# Patient Record
Sex: Female | Born: 1970 | Race: Black or African American | Hispanic: No | Smoking: Never smoker
Health system: Southern US, Community
[De-identification: ages and names within clinical notes are randomized; demographics above are authoritative.]

## PROBLEM LIST (undated history)

## (undated) DIAGNOSIS — I10 Essential (primary) hypertension: Secondary | ICD-10-CM

## (undated) DIAGNOSIS — M329 Systemic lupus erythematosus, unspecified: Secondary | ICD-10-CM

## (undated) DIAGNOSIS — IMO0002 Reserved for concepts with insufficient information to code with codable children: Secondary | ICD-10-CM

## (undated) HISTORY — PX: BUNIONETTE EXCISION: SUR500

## (undated) HISTORY — DX: Systemic lupus erythematosus, unspecified: M32.9

## (undated) HISTORY — DX: Essential (primary) hypertension: I10

## (undated) HISTORY — DX: Reserved for concepts with insufficient information to code with codable children: IMO0002

---

## 1998-03-20 ENCOUNTER — Emergency Department (HOSPITAL_COMMUNITY): Admission: EM | Admit: 1998-03-20 | Discharge: 1998-03-20 | Payer: Self-pay

## 1998-05-13 ENCOUNTER — Emergency Department (HOSPITAL_COMMUNITY): Admission: EM | Admit: 1998-05-13 | Discharge: 1998-05-13 | Payer: Self-pay | Admitting: Emergency Medicine

## 1998-08-17 ENCOUNTER — Other Ambulatory Visit: Admission: RE | Admit: 1998-08-17 | Discharge: 1998-08-17 | Payer: Self-pay | Admitting: Obstetrics and Gynecology

## 1999-08-08 ENCOUNTER — Other Ambulatory Visit: Admission: RE | Admit: 1999-08-08 | Discharge: 1999-08-08 | Payer: Self-pay | Admitting: Obstetrics and Gynecology

## 2000-05-08 ENCOUNTER — Encounter: Payer: Self-pay | Admitting: Endocrinology

## 2000-05-08 ENCOUNTER — Ambulatory Visit (HOSPITAL_COMMUNITY): Admission: RE | Admit: 2000-05-08 | Discharge: 2000-05-08 | Payer: Self-pay | Admitting: Endocrinology

## 2000-09-15 ENCOUNTER — Other Ambulatory Visit: Admission: RE | Admit: 2000-09-15 | Discharge: 2000-09-15 | Payer: Self-pay | Admitting: Obstetrics and Gynecology

## 2006-04-08 ENCOUNTER — Encounter: Admission: RE | Admit: 2006-04-08 | Discharge: 2006-04-08 | Payer: Self-pay | Admitting: Family Medicine

## 2008-03-24 IMAGING — CR DG CHEST 2V
2 series · 2 of 2 positions shown · non-contrast
Comparison: none

CLINICAL DATA: Chest pain

Chest 2 view:
No previous for comparison. The heart size and mediastinal contours are within
normal limits.  Both lungs are clear.  The visualized skeletal structures are
unremarkable.

[view not recorded (1 of 2)]
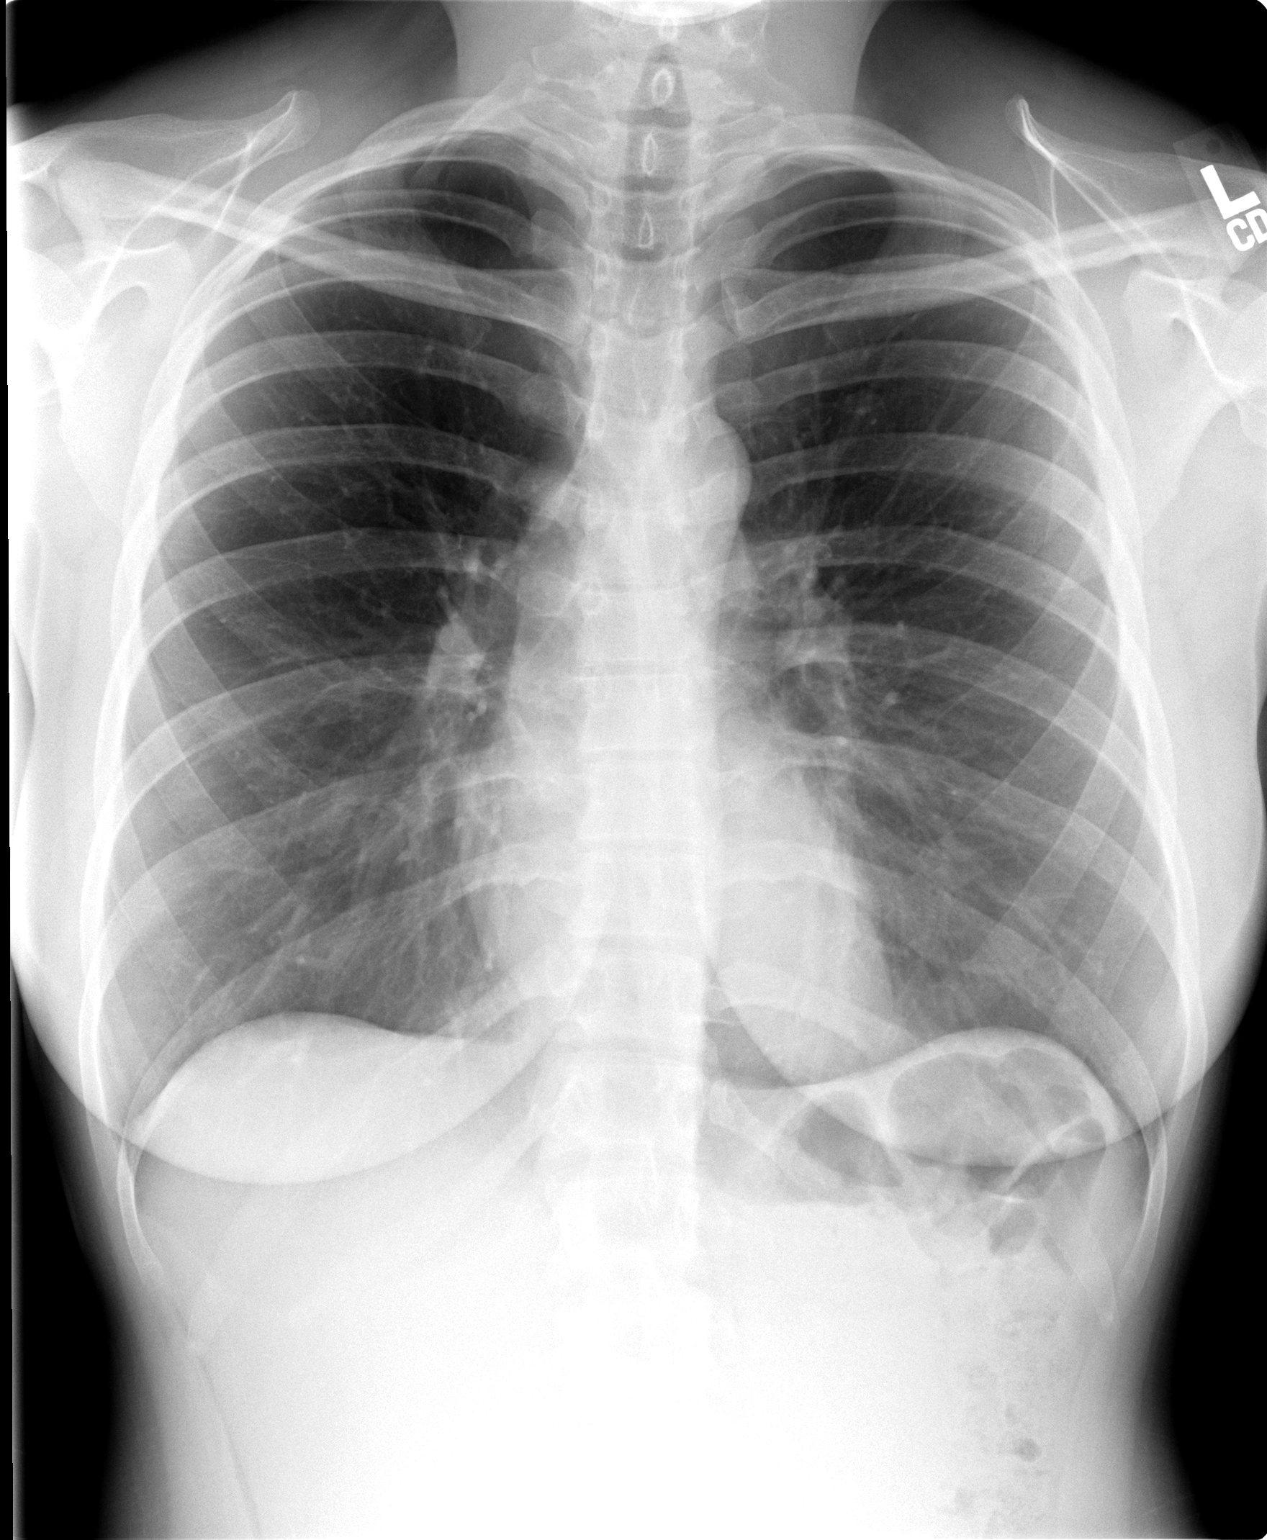

[view not recorded (2 of 2)]
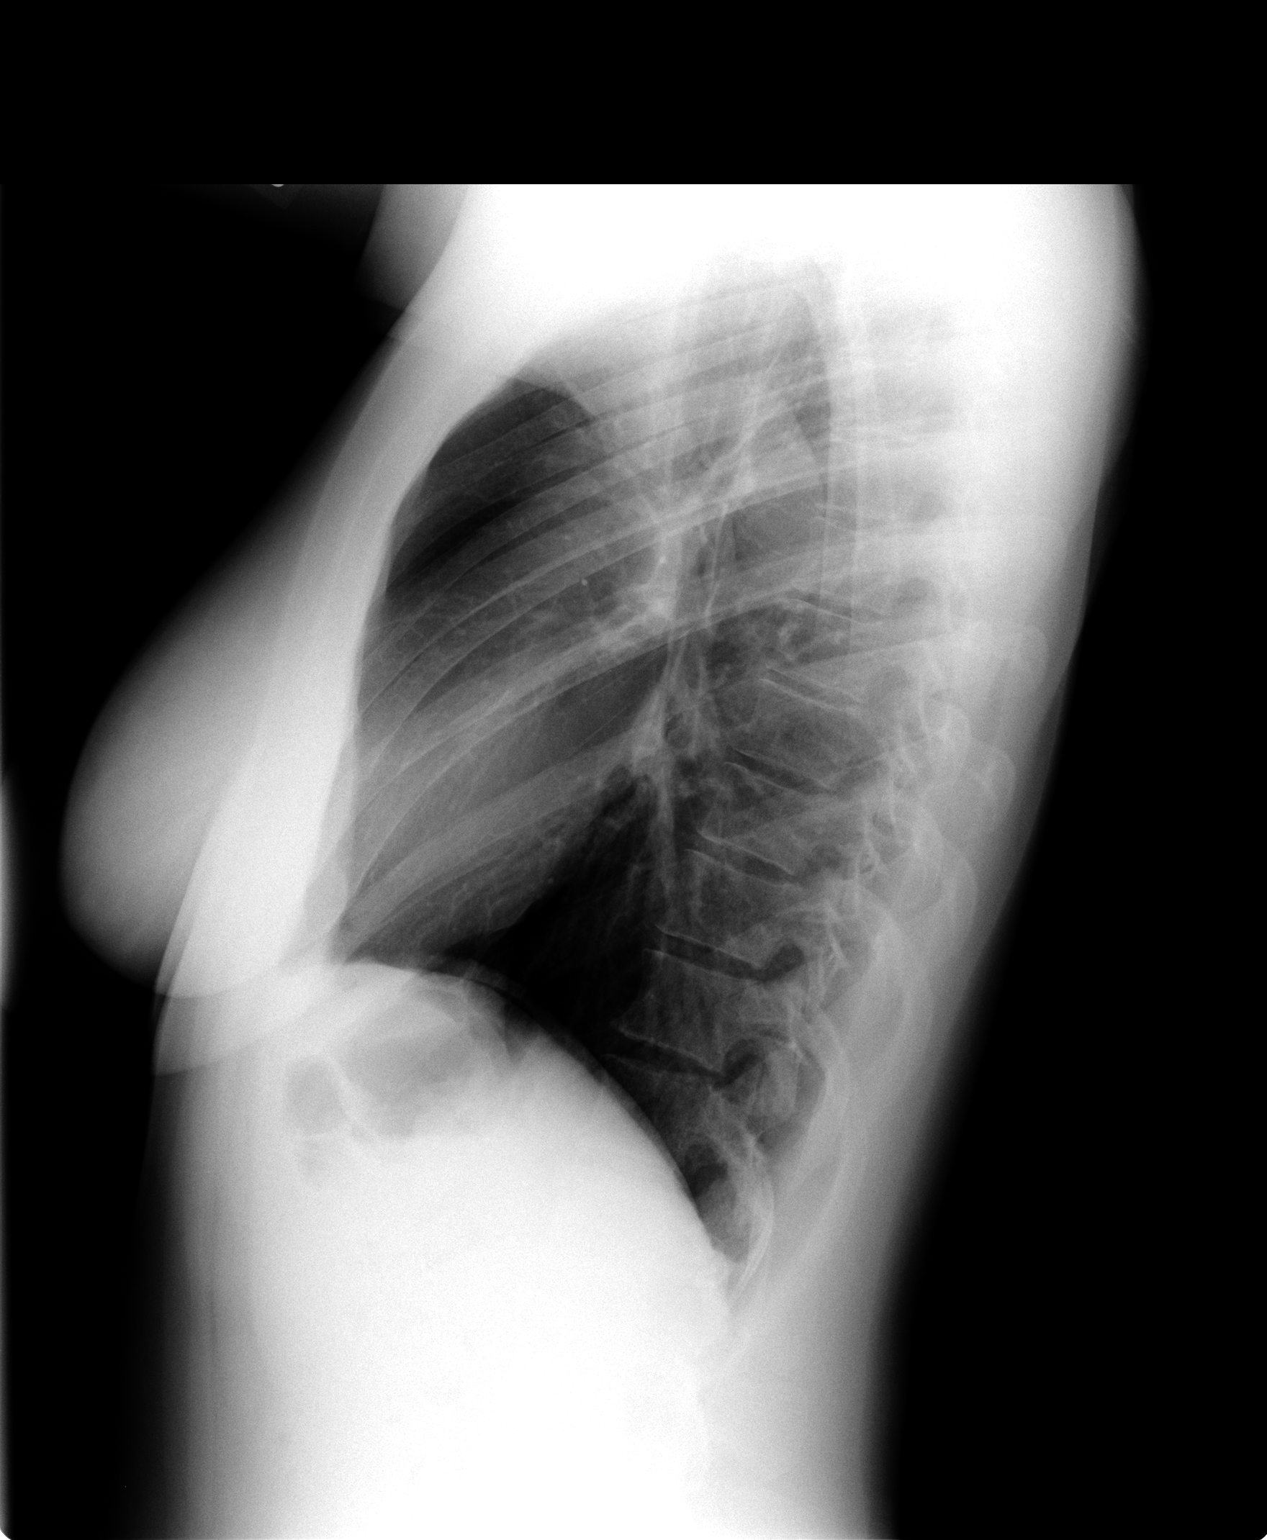

[2 of 2 positions shown; findings below may reference images not displayed]

IMPRESSION: 1. No active cardiopulmonary disease.

## 2009-04-02 ENCOUNTER — Inpatient Hospital Stay (HOSPITAL_COMMUNITY): Admission: AD | Admit: 2009-04-02 | Discharge: 2009-04-05 | Payer: Self-pay | Admitting: Obstetrics and Gynecology

## 2009-04-03 ENCOUNTER — Encounter (INDEPENDENT_AMBULATORY_CARE_PROVIDER_SITE_OTHER): Payer: Self-pay | Admitting: Obstetrics and Gynecology

## 2010-09-26 LAB — COMPREHENSIVE METABOLIC PANEL
AST: 36 U/L (ref 0–37)
CO2: 28 mEq/L (ref 19–32)
Calcium: 9.3 mg/dL (ref 8.4–10.5)
Creatinine, Ser: 0.85 mg/dL (ref 0.4–1.2)
GFR calc Af Amer: >60 mL/min (ref >60)
GFR calc non Af Amer: >60 mL/min (ref >60)
Total Protein: 5.4 g/dL — ABNORMAL LOW (ref 6.0–8.3)

## 2010-09-26 LAB — RPR: RPR Ser Ql: NONREACTIVE

## 2010-09-26 LAB — CBC
HCT: 39.2 % (ref 36.0–46.0)
Hemoglobin: 13.5 g/dL (ref 12.0–15.0)
MCHC: 34.5 g/dL (ref 30.0–36.0)
MCHC: 34.5 g/dL (ref 30.0–36.0)
MCV: 97.3 fL (ref 78.0–100.0)
MCV: 98.9 fL (ref 78.0–100.0)
Platelets: 169 10*3/uL (ref 150–400)
Platelets: 193 10*3/uL (ref 150–400)
RBC: 3.69 MIL/uL — ABNORMAL LOW (ref 3.87–5.11)
RBC: 4.02 MIL/uL (ref 3.87–5.11)
RDW: 12.8 % (ref 11.5–15.5)
RDW: 12.9 % (ref 11.5–15.5)
WBC: 12 10*3/uL — ABNORMAL HIGH (ref 4.0–10.5)

## 2010-09-26 LAB — RH IMMUNE GLOB WKUP(>/=20WKS)(NOT WOMEN'S HOSP)

## 2010-09-26 LAB — ANTI-DNA ANTIBODY, DOUBLE-STRANDED: ds DNA Ab: <1 IU/mL (ref <5)

## 2012-09-20 ENCOUNTER — Encounter: Payer: Self-pay | Admitting: Vascular Surgery

## 2012-09-20 ENCOUNTER — Other Ambulatory Visit: Payer: Self-pay | Admitting: *Deleted

## 2012-09-20 DIAGNOSIS — L819 Disorder of pigmentation, unspecified: Secondary | ICD-10-CM

## 2012-09-20 DIAGNOSIS — M329 Systemic lupus erythematosus, unspecified: Secondary | ICD-10-CM

## 2012-09-21 ENCOUNTER — Encounter (INDEPENDENT_AMBULATORY_CARE_PROVIDER_SITE_OTHER): Payer: BC Managed Care – PPO | Admitting: *Deleted

## 2012-09-21 ENCOUNTER — Ambulatory Visit (INDEPENDENT_AMBULATORY_CARE_PROVIDER_SITE_OTHER): Payer: BC Managed Care – PPO | Admitting: Vascular Surgery

## 2012-09-21 ENCOUNTER — Encounter: Payer: Self-pay | Admitting: Vascular Surgery

## 2012-09-21 VITALS — BP 159/111 | HR 89 | Resp 18 | Ht 64.0 in | Wt 172.0 lb

## 2012-09-21 DIAGNOSIS — L97509 Non-pressure chronic ulcer of other part of unspecified foot with unspecified severity: Secondary | ICD-10-CM

## 2012-09-21 DIAGNOSIS — M329 Systemic lupus erythematosus, unspecified: Secondary | ICD-10-CM

## 2012-09-21 DIAGNOSIS — M79609 Pain in unspecified limb: Secondary | ICD-10-CM

## 2012-09-21 DIAGNOSIS — L819 Disorder of pigmentation, unspecified: Secondary | ICD-10-CM

## 2012-09-21 NOTE — Progress Notes (Signed)
Vascular and Vein Specialist of [facility]   Patient name: Lori Lin MRN: [id number] DOB: 02-24-1971 Sex: female   Referred by: Parke Simmers  Reason for referral:  Chief Complaint  Patient presents with  . New Evaluation    Cyanosis of B/L feet  right worse    HISTORY OF PRESENT ILLNESS: Patient is a very pleasant 42 year old female who is diagnosed with lupus around age 51. 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 He typically does not have any pain associated with this but does have the classic color changes particularly with cold exposure. Over the past several months he has had a more cyanosis particular he in her right second and fifth toe. She does have pain associated with this now. She does have some very superficial callus and blistering on the tip of her right second toe. He does not have any pain in her hands. Has been treated with nifedipine in the past. Fortunately she has never smoked cigarettes. She did have bilateral bunion surgery in the past with no difficulty healing  Past Medical History  Diagnosis Date  . Lupus   . Hypertension     Past Surgical History  Procedure Laterality Date  . Bunionette excision Bilateral     History   Social History  . Marital Status: Divorced    Spouse Name: N/A    Number of Children: N/A  . Years of Education: N/A   Occupational History  . Not on file.   Social History Main Topics  . Smoking status: Never Smoker   . Smokeless tobacco: Never Used  . Alcohol Use: Yes  . Drug Use: No  . Sexually Active: Not on file   Other Topics Concern  . Not on file   Social History Narrative  . No narrative on file    Family History  Problem Relation Age of Onset  . Hypertension Mother   . Hypertension Father     Allergies as of 09/21/2012  . (No Known Allergies)    Current Outpatient Prescriptions on File Prior to Visit  Medication Sig Dispense Refill  . amLODipine (NORVASC) 5 MG tablet Take 5 mg by  mouth daily.      . hydrALAZINE (APRESOLINE) 25 MG tablet Take 25 mg by mouth 3 (three) times daily.      . hydroxychloroquine (PLAQUENIL) 200 MG tablet Take 200 mg by mouth 2 (two) times daily.       No current facility-administered medications on file prior to visit.     REVIEW OF SYSTEMS:  Positives indicated with an "X"  CARDIOVASCULAR:  [ ]  chest pain   [ ]  chest pressure   [ ]  palpitations   [ ]  orthopnea   [ ]  dyspnea on exertion   [ ]  claudication   [ ]  rest pain   [ ]  DVT   [ ]  phlebitis PULMONARY:   [ ]  productive cough   [ ]  asthma   [ ]  wheezing NEUROLOGIC:   [ ]  weakness  [ ]  paresthesias  [ ]  aphasia  [ ]  amaurosis  [ ]  dizziness HEMATOLOGIC:   [ ]  bleeding problems   [ ]  clotting disorders MUSCULOSKELETAL:  [ ]  joint pain   [ ]  joint swelling GASTROINTESTINAL: [ ]   blood in stool  [ ]   hematemesis GENITOURINARY:  [ ]   dysuria  [ ]   hematuria PSYCHIATRIC:  [ ]  history of major depression INTEGUMENTARY:  [ ]  rashes  [ ]  ulcers  CONSTITUTIONAL:  [ ]  fever   [ ]  chills  PHYSICAL EXAMINATION:  General: The patient is a well-nourished female, in no acute distress. Vital signs are BP 159/111  Pulse 89  Resp 18  Ht 5\' 4"  (1.626 m)  Wt 172 lb (78.019 kg)  BMI 29.51 kg/m2 Pulmonary: There is a good air exchange bilaterally without wheezing or rales. Abdomen: Soft and non-tender with normal pitch bowel sounds. Musculoskeletal: There are no major deformities.  Neurologic: No focal weakness or paresthesias are detected, Skin: Cyanosis alternating with rubor in her hands and to a lesser degree in her feet during the examination. She does have a rather fixed cyanosis in her right second and fifth toes. Psychiatric: The patient has normal affect. Cardiovascular: There is a regular rate and rhythm without significant murmur appreciated. Pulse status: 2+ radial 2+ femoral 2+ popliteal and 2+ dorsalis pedis pulse pulses bilaterally  [facility] Vascular Lab Studies:  Ordered and  Independently Reviewed normal ankle arm index bilaterally with biphasic posterior tibial and dorsalis pedis waveforms bilaterally she does have dampened the great toe waveform on the right and absent waveforms on the left great toe.  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 I explained there is no vas or surgical treatment options for this. She will continue to followup with Dr. Parke Simmers and her rheumatologist for maximal medical treatment of her lupus. I suspect that she will go on to healing of the lesions on her right toes. I explained that is outside chance this may progress to tissue loss and may even require toe amputation. I spent this is unlikely and that she should take some appeared or time but will eventually resolved. She'll notify should she develop any progressive tissue loss which time we will follow this to determine if amputation is required. Otherwise she will followup on an as-needed basis    EARLY, TODD V[facility] and Ve[facility] Specialists of [place] Office: [phone]

## 2012-09-22 ENCOUNTER — Encounter: Payer: Self-pay | Admitting: Family Medicine

## 2013-04-15 ENCOUNTER — Other Ambulatory Visit: Payer: Self-pay

## 2013-04-15 DIAGNOSIS — Z1231 Encounter for screening mammogram for malignant neoplasm of breast: Secondary | ICD-10-CM

## 2013-05-06 ENCOUNTER — Ambulatory Visit
Admission: RE | Admit: 2013-05-06 | Discharge: 2013-05-06 | Disposition: A | Discharge status: Home / Self Care | Payer: Self-pay | Source: Ambulatory Visit

## 2013-05-06 DIAGNOSIS — Z1231 Encounter for screening mammogram for malignant neoplasm of breast: Secondary | ICD-10-CM

## 2013-05-10 ENCOUNTER — Other Ambulatory Visit: Payer: Self-pay | Admitting: Obstetrics and Gynecology

## 2013-05-10 DIAGNOSIS — R928 Other abnormal and inconclusive findings on diagnostic imaging of breast: Secondary | ICD-10-CM

## 2013-06-03 ENCOUNTER — Ambulatory Visit
Admission: RE | Admit: 2013-06-03 | Discharge: 2013-06-03 | Disposition: A | Discharge status: Home / Self Care | Payer: Self-pay | Source: Ambulatory Visit | Attending: Obstetrics and Gynecology | Admitting: Obstetrics and Gynecology

## 2013-06-03 DIAGNOSIS — R928 Other abnormal and inconclusive findings on diagnostic imaging of breast: Secondary | ICD-10-CM

## 2013-11-24 ENCOUNTER — Other Ambulatory Visit: Payer: Self-pay | Admitting: Obstetrics and Gynecology

## 2013-11-24 DIAGNOSIS — N6489 Other specified disorders of breast: Secondary | ICD-10-CM

## 2013-11-24 DIAGNOSIS — R922 Inconclusive mammogram: Secondary | ICD-10-CM

## 2013-12-09 ENCOUNTER — Ambulatory Visit
Admission: RE | Admit: 2013-12-09 | Discharge: 2013-12-09 | Disposition: A | Discharge status: Home / Self Care | Payer: BC Managed Care – PPO | Source: Ambulatory Visit | Attending: Obstetrics and Gynecology | Admitting: Obstetrics and Gynecology

## 2013-12-09 DIAGNOSIS — R922 Inconclusive mammogram: Secondary | ICD-10-CM

## 2013-12-09 DIAGNOSIS — N6489 Other specified disorders of breast: Secondary | ICD-10-CM

## 2014-07-17 ENCOUNTER — Other Ambulatory Visit: Payer: Self-pay | Admitting: Obstetrics and Gynecology

## 2014-07-17 DIAGNOSIS — N6041 Mammary duct ectasia of right breast: Secondary | ICD-10-CM

## 2014-07-17 DIAGNOSIS — N63 Unspecified lump in unspecified breast: Secondary | ICD-10-CM

## 2014-07-24 ENCOUNTER — Ambulatory Visit
Admission: RE | Admit: 2014-07-24 | Discharge: 2014-07-24 | Disposition: A | Discharge status: Home / Self Care | Payer: BLUE CROSS/BLUE SHIELD | Source: Ambulatory Visit | Attending: Obstetrics and Gynecology | Admitting: Obstetrics and Gynecology

## 2014-07-24 DIAGNOSIS — N6041 Mammary duct ectasia of right breast: Secondary | ICD-10-CM

## 2016-03-31 ENCOUNTER — Other Ambulatory Visit: Payer: Self-pay | Admitting: Obstetrics and Gynecology

## 2016-03-31 DIAGNOSIS — Z1231 Encounter for screening mammogram for malignant neoplasm of breast: Secondary | ICD-10-CM

## 2016-04-11 ENCOUNTER — Ambulatory Visit
Admission: RE | Admit: 2016-04-11 | Discharge: 2016-04-11 | Disposition: A | Discharge status: Home / Self Care | Payer: BLUE CROSS/BLUE SHIELD | Source: Ambulatory Visit | Attending: Obstetrics and Gynecology | Admitting: Obstetrics and Gynecology

## 2016-04-11 ENCOUNTER — Encounter: Payer: Self-pay | Admitting: Radiology

## 2016-04-11 DIAGNOSIS — Z1231 Encounter for screening mammogram for malignant neoplasm of breast: Secondary | ICD-10-CM

## 2018-03-09 DIAGNOSIS — Z79899 Other long term (current) drug therapy: Secondary | ICD-10-CM | POA: Diagnosis not present

## 2018-03-09 DIAGNOSIS — N390 Urinary tract infection, site not specified: Secondary | ICD-10-CM | POA: Diagnosis not present

## 2018-03-09 DIAGNOSIS — M5136 Other intervertebral disc degeneration, lumbar region: Secondary | ICD-10-CM | POA: Diagnosis not present

## 2018-03-09 DIAGNOSIS — M329 Systemic lupus erythematosus, unspecified: Secondary | ICD-10-CM | POA: Diagnosis not present

## 2018-03-09 DIAGNOSIS — M549 Dorsalgia, unspecified: Secondary | ICD-10-CM | POA: Diagnosis not present

## 2018-03-09 DIAGNOSIS — I7300 Raynaud's syndrome without gangrene: Secondary | ICD-10-CM | POA: Diagnosis not present

## 2018-04-13 DIAGNOSIS — M329 Systemic lupus erythematosus, unspecified: Secondary | ICD-10-CM | POA: Diagnosis not present

## 2018-04-22 DIAGNOSIS — K7689 Other specified diseases of liver: Secondary | ICD-10-CM | POA: Diagnosis not present

## 2018-04-22 DIAGNOSIS — R945 Abnormal results of liver function studies: Secondary | ICD-10-CM | POA: Diagnosis not present

## 2018-04-22 DIAGNOSIS — M329 Systemic lupus erythematosus, unspecified: Secondary | ICD-10-CM | POA: Diagnosis not present

## 2018-09-02 DIAGNOSIS — L729 Follicular cyst of the skin and subcutaneous tissue, unspecified: Secondary | ICD-10-CM | POA: Diagnosis not present

## 2018-09-02 DIAGNOSIS — L089 Local infection of the skin and subcutaneous tissue, unspecified: Secondary | ICD-10-CM | POA: Diagnosis not present

## 2019-01-20 DIAGNOSIS — M329 Systemic lupus erythematosus, unspecified: Secondary | ICD-10-CM | POA: Diagnosis not present

## 2019-01-20 DIAGNOSIS — M549 Dorsalgia, unspecified: Secondary | ICD-10-CM | POA: Diagnosis not present

## 2019-01-20 DIAGNOSIS — I7300 Raynaud's syndrome without gangrene: Secondary | ICD-10-CM | POA: Diagnosis not present

## 2019-01-20 DIAGNOSIS — Z79899 Other long term (current) drug therapy: Secondary | ICD-10-CM | POA: Diagnosis not present

## 2019-02-10 DIAGNOSIS — Z1151 Encounter for screening for human papillomavirus (HPV): Secondary | ICD-10-CM | POA: Diagnosis not present

## 2019-02-10 DIAGNOSIS — N938 Other specified abnormal uterine and vaginal bleeding: Secondary | ICD-10-CM | POA: Diagnosis not present

## 2019-02-10 DIAGNOSIS — Z6827 Body mass index (BMI) 27.0-27.9, adult: Secondary | ICD-10-CM | POA: Diagnosis not present

## 2019-02-10 DIAGNOSIS — E039 Hypothyroidism, unspecified: Secondary | ICD-10-CM | POA: Diagnosis not present

## 2019-02-10 DIAGNOSIS — Z1231 Encounter for screening mammogram for malignant neoplasm of breast: Secondary | ICD-10-CM | POA: Diagnosis not present

## 2019-02-10 DIAGNOSIS — Z01419 Encounter for gynecological examination (general) (routine) without abnormal findings: Secondary | ICD-10-CM | POA: Diagnosis not present

## 2019-08-22 DIAGNOSIS — I7300 Raynaud's syndrome without gangrene: Secondary | ICD-10-CM | POA: Diagnosis not present

## 2019-08-22 DIAGNOSIS — M329 Systemic lupus erythematosus, unspecified: Secondary | ICD-10-CM | POA: Diagnosis not present

## 2019-08-22 DIAGNOSIS — M549 Dorsalgia, unspecified: Secondary | ICD-10-CM | POA: Diagnosis not present

## 2019-08-22 DIAGNOSIS — Z79899 Other long term (current) drug therapy: Secondary | ICD-10-CM | POA: Diagnosis not present

## 2019-09-23 DIAGNOSIS — H35411 Lattice degeneration of retina, right eye: Secondary | ICD-10-CM | POA: Diagnosis not present

## 2019-09-23 DIAGNOSIS — H25013 Cortical age-related cataract, bilateral: Secondary | ICD-10-CM | POA: Diagnosis not present

## 2019-09-23 DIAGNOSIS — Z79899 Other long term (current) drug therapy: Secondary | ICD-10-CM | POA: Diagnosis not present

## 2019-09-23 DIAGNOSIS — H04123 Dry eye syndrome of bilateral lacrimal glands: Secondary | ICD-10-CM | POA: Diagnosis not present

## 2020-04-21 ENCOUNTER — Ambulatory Visit: Payer: BLUE CROSS/BLUE SHIELD | Attending: Internal Medicine

## 2020-04-21 DIAGNOSIS — Z23 Encounter for immunization: Secondary | ICD-10-CM

## 2020-04-21 NOTE — Progress Notes (Signed)
   Covid-19 Vaccination Clinic  Name:  Sylvia Helms    MRN: [id number] DOB: 1971/01/02  04/21/2020  Ms. Brauner was observed post Covid-19 immunization for 15 minutes without incident. She was provided with Vaccine Information Sheet and instruction to access the V-Safe system.   Ms. Brennan was instructed to call [phone] with any severe reactions post vaccine: Marland Kitchen Difficulty breathing  . Swelling of face and throat  . A fast heartbeat  . A bad rash all over body  . Dizziness and weakness

## 2020-11-05 ENCOUNTER — Other Ambulatory Visit: Payer: Self-pay

## 2020-11-05 ENCOUNTER — Ambulatory Visit: Payer: BC Managed Care – PPO | Admitting: Cardiology

## 2020-11-05 ENCOUNTER — Encounter: Payer: Self-pay | Admitting: Cardiology

## 2020-11-05 VITALS — BP 185/116 | HR 109 | Temp 97.4°F | Resp 100 | Ht 64.0 in | Wt 170.2 lb

## 2020-11-05 DIAGNOSIS — M329 Systemic lupus erythematosus, unspecified: Secondary | ICD-10-CM

## 2020-11-05 DIAGNOSIS — I10 Essential (primary) hypertension: Secondary | ICD-10-CM

## 2020-11-05 MED ORDER — METOPROLOL SUCCINATE ER 50 MG PO TB24: 50.0000 mg | ORAL_TABLET | Freq: Every day | ORAL | 2 refills | Status: DC

## 2020-11-05 MED ORDER — AMLODIPINE BESYLATE 10 MG PO TABS: 10.0000 mg | ORAL_TABLET | Freq: Every evening | ORAL | 2 refills | Status: DC

## 2020-11-05 MED ORDER — VALSARTAN-HYDROCHLOROTHIAZIDE 80-12.5 MG PO TABS: 1.0000 | ORAL_TABLET | ORAL | 2 refills | Status: DC

## 2020-11-05 NOTE — Progress Notes (Signed)
Primary Physician/Referring:  Ellender Hose, NP  Patient ID: Lori Lin, female    DOB: [birth year], 50 y.o.   MRN: [id number]  Chief Complaint  Patient presents with  . Hypertension  . Pre-op Exam  . New Patient (Initial Visit)    Referred by Dr. Eulah Pont   HPI:    Lori Lin  is a 50 y.o. African-American female with systemic lupus erythematosus, presently on hydrochloric when, symptoms have been well managed with regard to arthritis.  She has had an accidental fall and needs left ankle surgery, preoperative evaluation reviewed markedly elevated blood pressure.  Stat consultation was obtained by Dr. Margarita Rana for cardiac restratification.  Patient denies any symptoms of dyspnea, PND or orthopnea, headache, visual disturbances.  States that she has never been told to have hypertension in the past until her ankle injury she has noticed elevated blood pressure.  She was started on amlodipine 5 mg by her PCP and metoprolol succinate 25 mg daily by Dr. Margarita Rana 2 days ago.  Past Medical History:  Diagnosis Date  . Hypertension   . Lupus [facility])    Past Surgical History:  Procedure Laterality Date  . BUNIONETTE EXCISION Bilateral    Family History  Problem Relation Age of Onset  . Hypertension Mother   . Seizures Mother   . Hypertension Father   . Heart attack Father     Social History   Tobacco Use  . Smoking status: Never Smoker  . Smokeless tobacco: Never Used  . Tobacco comment: 1 cigarette in her life  Substance Use Topics  . Alcohol use: Yes    Alcohol/week: 21.0 standard drinks    Types: 21 Glasses of wine per week   Marital Status: Divorced  ROS  Review of Systems  Cardiovascular: Negative for chest pain, dyspnea on exertion and leg swelling.  Musculoskeletal: Positive for joint pain and joint swelling.  Gastrointestinal: Negative for melena.   Objective  Blood pressure (!) 185/116, pulse (!) 109, temperature (!) 97.4 F (36.3 C), temperature source  Temporal, resp. rate (!) 100, height 5\' 4"  (1.626 m), weight 170 lb 3.2 oz (77.2 kg), SpO2 98 %. Body mass index is 29.21 kg/m.  Vitals with BMI 11/05/2020 11/05/2020 09/21/2012  Height - 5\' 4"  5\' 4"   Weight - 170 lbs 3 oz 172 lbs  BMI - 29.2 29.6  Systolic 185 173 11/21/2012  Diastolic 116 121  Pulse 109 110 89     Physical Exam  Constitutional: No distress.  Eyes: Conjunctivae are normal.  Neck: No JVD present.  Cardiovascular: Regular rhythm, normal heart sounds, intact distal pulses and normal pulses. Exam reveals no gallop.  No murmur heard. Pulmonary/Chest: Effort normal and breath sounds normal. She exhibits no tenderness.  Abdominal: Soft. Bowel sounds are normal.  Musculoskeletal:        General: No edema. Normal range of motion.     Cervical back: Neck supple.  Neurological: She is alert and oriented to person, place, and time.  Skin: Skin is warm.     Laboratory examination:   No results for input(s): NA, K, CL, CO2, GLUCOSE, BUN, CREATININE, CALCIUM, GFRNONAA, GFRAA in the last 8760 hours. CrCl cannot be calculated (Patient's most recent lab result is older than the maximum 21 days allowed.).  CMP Latest Ref Rng & Units 04/04/2009  Glucose 70 - 99 mg/dL 166)  BUN 6 - 23 mg/dL 6  Creatinine 0.4 - 1.2 mg/dL 063  Sodium 04/06/2009 - 01(S mEq/L 139  Potassium 3.5 - 5.1 mEq/L 4.6  Chloride 96 - 112 mEq/L 107  CO2 19 - 32 mEq/L 28  Calcium 8.4 - 10.5 mg/dL 9.3  Total Protein 6.0 - 8.3 g/dL 3.3(A)  Total Bilirubin 0.3 - 1.2 mg/dL 1.0  Alkaline Phos 39 - 117 U/L 180(H)  AST 0 - 37 U/L 36  ALT 0 - 35 U/L 17   CBC Latest Ref Rng & Units 04/04/2009 04/02/2009  WBC 4.0 - 10.5 K/uL 17.6(H) 12.0(H)  Hemoglobin 12.0 - 15.0 g/dL 25.0 53.9  Hematocrit 76.7 - 46.0 % 36.5 39.2  Platelets 150 - 400 K/uL 169 193    Lipid Panel No results for input(s): CHOL, TRIG, LDLCALC, VLDL, HDL, CHOLHDL, LDLDIRECT in the last 8760 hours. Lipid Panel  No results found for: CHOL, TRIG, HDL, CHOLHDL,  VLDL, LDLCALC, LDLDIRECT, LABVLDL   HEMOGLOBIN A1C No results found for: HGBA1C, MPG TSH No results for input(s): TSH in the last 8760 hours.  Medications and allergies  No Known Allergies   Current Outpatient Medications on File Prior to Visit  Medication Sig Dispense Refill  . Cholecalciferol (VITAMIN D3) 2000 UNITS TABS Take 2,000 mg by mouth daily.    . hydroxychloroquine (PLAQUENIL) 200 MG tablet Take 200 mg by mouth 2 (two) times daily.    . Multiple Vitamin (MULTIVITAMIN) tablet Take 1 tablet by mouth daily.     No current facility-administered medications on file prior to visit.     Radiology:   No results found.  Cardiac Studies:   None EKG:     EKG 11/05/2020: Sinus tachycardia at rate of 111 bpm, left atrial enlargement, normal axis.  Incomplete right bundle branch block.  No evidence of ischemia.     Assessment     ICD-10-CM   1. Accelerated hypertension  I10 EKG 12-Lead    amLODipine (NORVASC) 10 MG tablet    metoprolol succinate (TOPROL-XL) 50 MG 24 hr tablet    valsartan-hydrochlorothiazide (DIOVAN HCT) 80-12.5 MG tablet    PCV ECHOCARDIOGRAM COMPLETE  2. Lupus (HCC)  M32.9      Medications Discontinued During This Encounter  Medication Reason  . vitamin B-12 (CYANOCOBALAMIN) 1000 MCG tablet Error  . hydrALAZINE (APRESOLINE) 25 MG tablet Error  . amLODipine (NORVASC) 5 MG tablet Dose change  . metoprolol succinate (TOPROL-XL) 25 MG 24 hr tablet Dose change    Meds ordered this encounter  Medications  . amLODipine (NORVASC) 10 MG tablet    Sig: Take 1 tablet (10 mg total) by mouth every evening.    Dispense:  30 tablet    Refill:  2  . metoprolol succinate (TOPROL-XL) 50 MG 24 hr tablet    Sig: Take 1 tablet (50 mg total) by mouth daily. Take with or immediately following a meal.    Dispense:  30 tablet    Refill:  2  . valsartan-hydrochlorothiazide (DIOVAN HCT) 80-12.5 MG tablet    Sig: Take 1 tablet by mouth every morning.    Dispense:  30  tablet    Refill:  2   Orders Placed This Encounter  Procedures  . EKG 12-Lead  . PCV ECHOCARDIOGRAM COMPLETE    Standing Status:   Future    Standing Expiration Date:   11/05/2021   Recommendations:   Lori Lin is a 50 y.o. African-American female with systemic lupus erythematosus, presently on hydrochloric when, symptoms have been well managed with regard to arthritis.  She has had an accidental fall and needs left ankle surgery, preoperative evaluation  reviewed markedly elevated blood pressure.  Stat consultation was obtained by Dr. Margarita Rana for cardiac restratification.  Patient denies any symptoms of dyspnea, PND or orthopnea, headache, visual disturbances.  There is no clinical evidence of heart failure, EKG does not reveal any ischemia except for underlying sinus tachycardia and left atrial abnormality.  Suspect she probably has accelerated hypertension due to underlying pain in her left leg and probably has underlying primary hypertension as well.  I increased the dose of amlodipine from 5 mg to 10 mg daily in the evening and increase metoprolol succinate from 25 to 50 mg in the morning due to underlying sinus tachycardia as well and added valsartan HCT 160/12.5 mg in the morning.  She will need an echocardiogram in view of hypertension and abnormal EKG, but from cardiac standpoint suspect her blood pressure will be controlled with the above changes that have performed toda  She is undergoing low risk surgery, she does not have diabetes, history of tobacco use or obesity and overall no clinical evidence of heart failure as well.  She can be taken up for the upcoming surgery with low risk.  I would like to see her back in 4 to 6 weeks for follow-up, she will keep monitoring her blood pressure over the next 3 days prior to her surgery scheduled on 11/08/2020.    Yates Decamp, MD, [facility] 11/05/2020, 11:05 PM Office: [phone]  CC Margarita Rana, MD

## 2020-11-14 ENCOUNTER — Ambulatory Visit: Payer: BC Managed Care – PPO

## 2020-11-14 ENCOUNTER — Encounter: Payer: BC Managed Care – PPO | Admitting: Cardiology

## 2020-11-14 ENCOUNTER — Other Ambulatory Visit: Payer: Self-pay

## 2020-11-14 DIAGNOSIS — I10 Essential (primary) hypertension: Secondary | ICD-10-CM

## 2020-11-17 NOTE — Progress Notes (Signed)
Echocardiogram 11/14/2020: Left ventricle cavity is normal in size and wall thickness. Normal global wall motion. Normal LV systolic function with EF 55%. Doppler evidence of grade I (impaired) diastolic dysfunction, normal LAP.  No significant valvular abnormality. Normal right atrial pressure.

## 2020-12-17 ENCOUNTER — Ambulatory Visit: Payer: BC Managed Care – PPO | Admitting: Cardiology

## 2020-12-21 ENCOUNTER — Encounter: Payer: Self-pay | Admitting: Cardiology

## 2020-12-21 ENCOUNTER — Other Ambulatory Visit: Payer: Self-pay

## 2020-12-21 ENCOUNTER — Ambulatory Visit: Payer: BC Managed Care – PPO | Admitting: Cardiology

## 2020-12-21 VITALS — BP 105/71 | HR 94 | Temp 98.1°F | Resp 16 | Ht 64.0 in | Wt 165.4 lb

## 2020-12-21 DIAGNOSIS — M329 Systemic lupus erythematosus, unspecified: Secondary | ICD-10-CM

## 2020-12-21 DIAGNOSIS — I10 Essential (primary) hypertension: Secondary | ICD-10-CM

## 2020-12-21 MED ORDER — VALSARTAN-HYDROCHLOROTHIAZIDE 80-12.5 MG PO TABS: 1.0000 | ORAL_TABLET | ORAL | 0 refills | Status: AC

## 2020-12-21 MED ORDER — METOPROLOL SUCCINATE ER 50 MG PO TB24: 50.0000 mg | ORAL_TABLET | Freq: Every day | ORAL | 0 refills | Status: AC

## 2020-12-21 MED ORDER — AMLODIPINE BESYLATE 10 MG PO TABS: 10.0000 mg | ORAL_TABLET | Freq: Every evening | ORAL | 0 refills | Status: AC

## 2020-12-21 NOTE — Progress Notes (Addendum)
Primary Physician/Referring:  Ellender Hose, NP  Patient ID: Lori Lin, female    DOB: [birth year], 50 y.o.   MRN: [id number]  Chief Complaint  Patient presents with   Hypertension    HPI:    Lori Lin  is a 50 y.o. African-American female with systemic lupus erythematosus, presently on hydroxychloroquine, symptoms have been well managed with regard to arthritis.  She has had an accidental fall and needs left ankle surgery, preoperative evaluation reviewed markedly elevated blood pressure.  Stat consultation was obtained by Dr. Margarita Rana for cardiac risk stratification.  I had seen her 6 weeks ago and cleared her for surgery.  Patient denies any symptoms of dyspnea, PND or orthopnea, headache, visual disturbances.  Since increasing the dose of the beta-blocker and also amlodipine and addition of valsartan HCT, patient states that her blood pressure is very well controlled.  No dizziness or syncope, overall feels well and had surgery to her left ankle without any perioperative complications.  Past Medical History:  Diagnosis Date   Hypertension    Lupus (HCC)    Past Surgical History:  Procedure Laterality Date   BUNIONETTE EXCISION Bilateral    Family History  Problem Relation Age of Onset   Hypertension Mother    Seizures Mother    Hypertension Father    Heart attack Father     Social History   Tobacco Use   Smoking status: Never   Smokeless tobacco: Never   Tobacco comments:    1 cigarette in her life  Substance Use Topics   Alcohol use: Yes    Alcohol/week: 21.0 standard drinks    Types: 21 Glasses of wine per week   Marital Status: Divorced  ROS  Review of Systems  Cardiovascular:  Negative for chest pain, dyspnea on exertion and leg swelling.  Musculoskeletal:  Positive for joint pain and joint swelling.  Gastrointestinal:  Negative for melena.  Objective  Blood pressure 105/71, pulse 94, temperature 98.1 F (36.7 C), temperature source Temporal, resp.  rate 16, height 5\' 4"  (1.626 m), weight 165 lb 6.4 oz (75 kg), SpO2 98 %. Body mass index is 28.39 kg/m.  Vitals with BMI 12/21/2020 11/05/2020 11/05/2020  Height 5\' 4"  - 5\' 4"   Weight 165 lbs 6 oz - 170 lbs 3 oz  BMI 28.38 - 29.2  Systolic 105 185 11/07/2020  Diastolic 71 116 121  Pulse 94 110     Physical Exam  Constitutional: No distress.  Eyes: Conjunctivae are normal.  Neck: No JVD present.  Cardiovascular: Regular rhythm, normal heart sounds, intact distal pulses and normal pulses. Exam reveals no gallop.  No murmur heard. Pulmonary/Chest: Effort normal and breath sounds normal. She exhibits no tenderness.  Abdominal: Soft. Bowel sounds are normal.  Musculoskeletal:        General: No edema. Normal range of motion.     Cervical back: Neck supple.  Neurological: She is alert and oriented to person, place, and time.  Skin: Skin is warm.    Laboratory examination:   Recent Labs    12/21/20 1020  NA 139  K 5.1  CL 99  CO2 23  GLUCOSE 85  BUN 23  CREATININE 1.47*  CALCIUM 10.7*   estimated creatinine clearance is 46.9 mL/min (A) (by C-G formula based on SCr of 1.47 mg/dL (H)).  CMP Latest Ref Rng & Units 12/21/2020 04/04/2009  Glucose 65 - 99 mg/dL 85 02/21/21)  BUN 6 - 24 mg/dL 23 6  Creatinine 02/21/2021 -  1.00 mg/dL 3.53(I) 1.44  Sodium 315 - 144 mmol/L 139 139  Potassium 3.5 - 5.2 mmol/L 5.1 4.6  Chloride 96 - 106 mmol/L 99 107  CO2 20 - 29 mmol/L 23 28  Calcium 8.7 - 10.2 mg/dL 10.7(H) 9.3  Total Protein 6.0 - 8.3 g/dL - 5.4(L)  Total Bilirubin 0.3 - 1.2 mg/dL - 1.0  Alkaline Phos 39 - 117 U/L - 180(H)  AST 0 - 37 U/L - 36  ALT 0 - 35 U/L - 17   CBC Latest Ref Rng & Units 04/04/2009 04/02/2009  WBC 4.0 - 10.5 K/uL 17.6(H) 12.0(H)  Hemoglobin 12.0 - 15.0 g/dL 40.0 86.7  Hematocrit 61.9 - 46.0 % 36.5 39.2  Platelets 150 - 400 K/uL 169 193    Lipid Panel No results for input(s): CHOL, TRIG, LDLCALC, VLDL, HDL, CHOLHDL, LDLDIRECT in the last 8760 hours. Lipid Panel  No  results found for: CHOL, TRIG, HDL, CHOLHDL, VLDL, LDLCALC, LDLDIRECT, LABVLDL   HEMOGLOBIN A1C No results found for: HGBA1C, MPG TSH No results for input(s): TSH in the last 8760 hours.  Medications and allergies  No Known Allergies   Current Outpatient Medications on File Prior to Visit  Medication Sig Dispense Refill   Cholecalciferol (VITAMIN D3) 2000 UNITS TABS Take 2,000 mg by mouth daily.     hydroxychloroquine (PLAQUENIL) 200 MG tablet Take 200 mg by mouth 2 (two) times daily.     Multiple Vitamin (MULTIVITAMIN) tablet Take 1 tablet by mouth daily.     No current facility-administered medications on file prior to visit.     Radiology:   No results found.  Cardiac Studies:   Echocardiogram 11/14/2020: Left ventricle cavity is normal in size and wall thickness. Normal global wall motion. Normal LV systolic function with EF 55%. Doppler evidence of grade I (impaired) diastolic dysfunction, normal LAP.  No significant valvular abnormality. Normal right atrial pressure.   EKG:     EKG 11/05/2020: Sinus tachycardia at rate of 111 bpm, left atrial enlargement, normal axis.  Incomplete right bundle branch block.  No evidence of ischemia.     Assessment     ICD-10-CM   1. Primary hypertension  I10 amLODipine (NORVASC) 10 MG tablet    metoprolol succinate (TOPROL-XL) 50 MG 24 hr tablet    valsartan-hydrochlorothiazide (DIOVAN HCT) 80-12.5 MG tablet    Basic metabolic panel    Basic metabolic panel    2. Systemic lupus erythematosus (SLE) in adult [facility])  M32.9       Medications Discontinued During This Encounter  Medication Reason   amLODipine (NORVASC) 10 MG tablet Reorder   metoprolol succinate (TOPROL-XL) 50 MG 24 hr tablet Reorder   valsartan-hydrochlorothiazide (DIOVAN HCT) 80-12.5 MG tablet Reorder    Meds ordered this encounter  Medications   amLODipine (NORVASC) 10 MG tablet    Sig: Take 1 tablet (10 mg total) by mouth every evening.    Dispense:  90  tablet    Refill:  0   metoprolol succinate (TOPROL-XL) 50 MG 24 hr tablet    Sig: Take 1 tablet (50 mg total) by mouth daily. Take with or immediately following a meal.    Dispense:  90 tablet    Refill:  0   valsartan-hydrochlorothiazide (DIOVAN HCT) 80-12.5 MG tablet    Sig: Take 1 tablet by mouth every morning.    Dispense:  90 tablet    Refill:  0   Orders Placed This Encounter  Procedures   Basic metabolic panel  Basic metabolic panel    Standing Status:   Future    Standing Expiration Date:   12/22/2021    Recommendations:   Yoshi Vicencio is a 50 y.o. African-American female with systemic lupus erythematosus, Raynaud's phenomena, presently on hydroxychloroquine, symptoms have been well managed with regard to arthritis.  She has had an accidental fall and needs left ankle surgery, preoperative evaluation reviewed markedly elevated blood pressure.  Stat consultation was obtained by Dr. Margarita Rana for cardiac risk stratification.  I had seen her 6 weeks ago and cleared her for surgery.  Patient denies any symptoms of dyspnea, PND or orthopnea, headache, visual disturbances.  She states that since increasing the dose of the medications on her last office visit for amlodipine from 5 mg to 10 mg and metoprolol succinate from 25 mg to 50 mg and addition of valsartan HCT 160/12.5 mg in the morning, she has had excellent control of hypertension and overall feels well.  Underwent left ankle surgery and is excited about returning back to work.  She remains asymptomatic.  Echocardiogram is also within normal limits.  With regard to systemic lupus, symptoms are completely well controlled without Raynaud's or any other symptoms of systemic lupus since being on hydroxychloroquine.  No changes were done by me today, she will follow-up with her PCP for further management of hypertension including prescription refills.    I did advise her to obtain a BMP in view of starting losartan HCT as a  baseline.  I will see her back on a as needed basis.   Yates Decamp, MD, [facility] 12/22/2020, 6:10 PM Office: [phone]  Add: Renal function is moderately reduced. I do not have a baseline recent renal function. I will repeat BMP in 1 month and patient has been informed to f/u with PCP.   Yates Decamp, MD, [facility] 12/22/2020, 6:11 PM Office: [phone] Fax: [phone] Pager: [phone]

## 2020-12-22 LAB — BASIC METABOLIC PANEL
BUN/Creatinine Ratio: 16 (ref 9–23)
BUN: 23 mg/dL (ref 6–24)
CO2: 23 mmol/L (ref 20–29)
Calcium: 10.7 mg/dL — ABNORMAL HIGH (ref 8.7–10.2)
Chloride: 99 mmol/L (ref 96–106)
Creatinine, Ser: 1.47 mg/dL — ABNORMAL HIGH (ref 0.57–1.00)
Glucose: 85 mg/dL (ref 65–99)
Potassium: 5.1 mmol/L (ref 3.5–5.2)
Sodium: 139 mmol/L (ref 134–144)
eGFR: 44 mL/min/{1.73_m2} — ABNORMAL LOW (ref >59)

## 2020-12-22 NOTE — Addendum Note (Signed)
Addended by: Delrae Rend on: 12/22/2020 06:11 PM   Modules accepted: Orders

## 2020-12-26 ENCOUNTER — Other Ambulatory Visit: Payer: Self-pay | Admitting: Family Medicine

## 2020-12-26 DIAGNOSIS — R5381 Other malaise: Secondary | ICD-10-CM

## 2021-01-04 ENCOUNTER — Telehealth: Payer: Self-pay

## 2021-01-04 NOTE — Telephone Encounter (Signed)
Original message from JG:   Recheck in 1 month. Add: Renal function is moderately reduced. I do not have a baseline recent renal function.   I would obtain latest labs from PCP to comare. Serum Creatinine 1.4.   I will repeat BMP in 1 month and patient is to f/u with PCP.   I could not leave a voice message on  her cell phone   JG    Called patient, NA, could not leave a message.

## 2021-01-04 NOTE — Telephone Encounter (Signed)
-----   Message from Yates Decamp, MD sent at 12/27/2020  9:08 PM EDT ----- Regarding: Abnormal labs and renal function   Recheck in 1 month. Add: Renal function is moderately reduced. I do not have a baseline recent renal function.   I would obtain latest labs from PCP to comare. Serum Creatinine 1.4.   I will repeat BMP in 1 month and patient is to f/u with PCP.  I could not leave a voice message on her cell phone  JG

## 2021-01-08 NOTE — Telephone Encounter (Signed)
Called patient, VM box is full. Cannot leave a message.

## 2023-11-12 ENCOUNTER — Other Ambulatory Visit: Payer: Self-pay | Admitting: Nurse Practitioner

## 2023-11-12 DIAGNOSIS — R748 Abnormal levels of other serum enzymes: Secondary | ICD-10-CM

## 2023-11-18 ENCOUNTER — Ambulatory Visit
Admission: RE | Admit: 2023-11-18 | Discharge: 2023-11-18 | Disposition: A | Discharge status: Home / Self Care | Source: Ambulatory Visit | Attending: Nurse Practitioner | Admitting: Nurse Practitioner

## 2023-11-18 DIAGNOSIS — R748 Abnormal levels of other serum enzymes: Secondary | ICD-10-CM
